# Patient Record
Sex: Male | Born: 1995 | Race: White | Hispanic: No | Marital: Single | Smoking: Never smoker
Health system: Southern US, Community
[De-identification: ages and names within clinical notes are randomized; demographics above are authoritative.]

## PROBLEM LIST (undated history)

## (undated) HISTORY — PX: OTHER SURGICAL HISTORY: SHX169

---

## 2019-11-11 ENCOUNTER — Emergency Department (HOSPITAL_BASED_OUTPATIENT_CLINIC_OR_DEPARTMENT_OTHER)
Admission: EM | Admit: 2019-11-11 | Discharge: 2019-11-11 | Disposition: A | Discharge status: Home / Self Care | Attending: Emergency Medicine | Admitting: Emergency Medicine

## 2019-11-11 ENCOUNTER — Emergency Department (HOSPITAL_BASED_OUTPATIENT_CLINIC_OR_DEPARTMENT_OTHER)

## 2019-11-11 ENCOUNTER — Other Ambulatory Visit: Payer: Self-pay

## 2019-11-11 ENCOUNTER — Encounter (HOSPITAL_BASED_OUTPATIENT_CLINIC_OR_DEPARTMENT_OTHER): Payer: Self-pay

## 2019-11-11 DIAGNOSIS — M79641 Pain in right hand: Secondary | ICD-10-CM | POA: Insufficient documentation

## 2019-11-11 NOTE — ED Triage Notes (Signed)
Pt injured right hand at work ~545pm-no break in skin noted-NAD-steady gait

## 2019-11-11 NOTE — ED Notes (Signed)
Supervisor Arrie Aran informed we are not able to do ETOh , but can to UDs ,verbalundrstaindign to do UDs

## 2019-11-11 NOTE — ED Provider Notes (Signed)
[facility] EMERGENCY DEPARTMENT Provider Note   CSN: [id number] Arrival date & time: 11/11/19  1954    History Chief Complaint  Patient presents with  . Hand Injury    Marco Henderson is a 24 y.o. male with no significant past medical history who presents for evaluation of arm injury which occurred just PTA.  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  There is a piece of [place] which was stuck and he subsequently hit the palmar aspect of his right hand as well as the ulnar aspect of his right forearm.  Patient states he has pain rating from his hand through his forearm at this time.  Rates his pain a 5/10.  He has full range of motion however has pain with range of motion to his wrist.  Denies any pain at his scaphoid.  Denies redness, swelling, warmth, contusions, abrasions, lacerations, paresthesias, fever, chills, nausea or vomiting.  Denies aggravating or alleviating factors.  This is a new problem.   History obtained from patient and past medical records.  No interpreter is used.  HPI     History reviewed. No pertinent past medical history.  There are no problems to display for this patient.   Past Surgical History:  Procedure Laterality Date  . arm surgery         No family history on file.  Social History   Tobacco Use  . Smoking status: Never Smoker  . Smokeless tobacco: Never Used  Substance Use Topics  . Alcohol use: Yes    Comment: occ  . Drug use: Never    Home Medications Prior to Admission medications   Not on File    Allergies    Patient has no known allergies.  Review of Systems   Review of Systems  Constitutional: Negative.   HENT: Negative.   Respiratory: Negative.   Cardiovascular: Negative.   Gastrointestinal: Negative.   Genitourinary: Negative.   Musculoskeletal:       Right hand and forearm  Skin: Negative.   Neurological: Negative.   All other systems reviewed and are negative.   Physical  Exam Updated Vital Signs BP 101/82 (BP Location: Left Arm)   Pulse 66   Temp 98.2 F (36.8 C) (Oral)   Resp 16   Ht 6\' 1"  (1.854 m)   Wt 104.3 kg   SpO2 100%   BMI 30.34 kg/m   Physical Exam Vitals and nursing note reviewed.  Constitutional:      General: He is not in acute distress.    Appearance: He is well-developed. He is not ill-appearing, toxic-appearing or diaphoretic.  HENT:     Head: Normocephalic and atraumatic.     Mouth/Throat:     Mouth: Mucous membranes are moist.  Eyes:     Pupils: Pupils are equal, round, and reactive to light.  Cardiovascular:     Rate and Rhythm: Normal rate and regular rhythm.     Pulses: Normal pulses.          Radial pulses are 2+ on the right side and 2+ on the left side.     Heart sounds: Normal heart sounds.  Pulmonary:     Effort: Pulmonary effort is normal. No respiratory distress.     Breath sounds: Normal breath sounds.  Abdominal:     General: Bowel sounds are normal. There is no distension.     Palpations: Abdomen is soft.  Musculoskeletal:        General:  Tenderness present. No swelling, deformity or signs of injury. Normal range of motion.     Right shoulder: Normal.     Left shoulder: Normal.     Right upper arm: Normal.     Left upper arm: Normal.     Right elbow: Normal.     Left elbow: Normal.     Right forearm: Tenderness present. No swelling, edema, deformity, lacerations or bony tenderness.     Left forearm: Normal.     Right wrist: Normal. No bony tenderness or snuff box tenderness.     Left wrist: Normal.     Right hand: Tenderness present. No swelling, deformity or bony tenderness. Normal range of motion. Normal strength.     Left hand: Normal.       Hands:     Cervical back: Normal range of motion and neck supple.     Right lower leg: No edema.     Left lower leg: No edema.     Comments: Compartments soft.  Able to flex and extend as well as pronate and supinate at right upper extremity.  Moves digits  without difficulty.  No obvious injury, deformity or trauma.  He does have some tenderness to the palmar aspect of his right hand. No tenderness over scaphoid  Skin:    General: Skin is warm and dry.     Capillary Refill: Capillary refill takes less than 2 seconds.     Comments: No edema, erythema or warmth.  No fluctuance or induration.  No contusions, abrasions or lacerations  Neurological:     General: No focal deficit present.     Mental Status: He is alert.     Cranial Nerves: Cranial nerves are intact.     Sensory: Sensation is intact.     Motor: Motor function is intact.     Coordination: Coordination is intact.     Gait: Gait is intact.     Comments: 5/5 trying to bilateral upper extremities at difficulty.  Intact sensation to radial and ulnar aspect.    ED Results / Procedures / Treatments   Labs (all labs ordered are listed, but only abnormal results are displayed) Labs Reviewed  RAPID URINE DRUG SCREEN, HOSP PERFORMED    EKG None  Radiology DG Forearm Right  Result Date: 11/11/2019 CLINICAL DATA:  Pain EXAM: RIGHT FOREARM - 2 VIEW COMPARISON:  None. FINDINGS: There is no evidence of fracture or other focal bone lesions. Soft tissues are unremarkable. IMPRESSION: Negative. Electronically Signed   By: Katherine Mantle M.D.   On: 11/11/2019 20:27   DG Hand Complete Right  Result Date: 11/11/2019 CLINICAL DATA:  Pain EXAM: RIGHT HAND - COMPLETE 3+ VIEW COMPARISON:  None. FINDINGS: There is no evidence of fracture or dislocation. There is no evidence of arthropathy or other focal bone abnormality. Soft tissues are unremarkable. There is an old healed fracture of the fifth metacarpal. IMPRESSION: Negative. Electronically Signed   By: Katherine Mantle M.D.   On: 11/11/2019 20:26    Procedures Procedures (including critical care time)  Medications Ordered in ED Medications - No data to display  ED Course  I have reviewed the triage vital signs and the nursing  notes.  Pertinent labs & imaging results that were available during my care of the patient were reviewed by me and considered in my medical decision making (see chart for details).  24 year old male presents for evaluation of work injury.  He is afebrile, nonseptic, non-ill-appearing.  Diffuse tenderness to plantar  aspect of his hand which she states radiates into his forearm however I cannot reproduce his pain to his forearm.  He is able to flex, extend, pronate and supinate.  He has no pain over his scaphoid.  He has no overlying skin changes to suggest infectious process.  No contusions, abrasions or lacerations to suggest open fracture.  He has no systemic symptoms.  Plan on imaging and reassess.  Imaging personally interpreted and do not show fracture/ dislocation or effusion.  Patient reassessed.  Discussed imaging findings.  Will place in Velcro splint.  He will follow up with orthopedics outpatient.  RICE for symptomatic management.  Low suspicion for occult fracture, dislocation, septic joint, compartment syndrome, DVT, myositis.  The patient has been appropriately medically screened and/or stabilized in the ED. I have low suspicion for any other emergent medical condition which would require further screening, evaluation or treatment in the ED or require inpatient management.  Patient is hemodynamically stable and in no acute distress.  Patient able to ambulate in department prior to ED.  Evaluation does not show acute pathology that would require ongoing or additional emergent interventions while in the emergency department or further inpatient treatment.  I have discussed the diagnosis with the patient and answered all questions.  Pain is been managed while in the emergency department and patient has no further complaints prior to discharge.  Patient is comfortable with plan discussed in room and is stable for discharge at this time.  I have discussed strict return precautions for returning  to the emergency department.  Patient was encouraged to follow-up with PCP/specialist refer to at discharge.  Employer requesting UDS.  Will obtain prior to dc    MDM Rules/Calculators/A&P                       Final Clinical Impression(s) / ED Diagnoses Final diagnoses:  Right hand pain    Rx / DC Orders ED Discharge Orders    None       Trypp Heckmann A, PA-C 11/11/19 2043    Geoffery Lyons, MD 11/11/19 2318

## 2019-11-11 NOTE — Discharge Instructions (Signed)
Take Tylenol and ibuprofen as needed for pain.  I have placed you in a splint.  I suggest resting over the next 48 hours.  If you continue to have pain beyond 5 days you need to follow with orthopedics.  Their contact information is listed in your discharge paperwork.  You need to call them to schedule an appointment

## 2020-02-06 ENCOUNTER — Emergency Department (HOSPITAL_COMMUNITY): Payer: 59

## 2020-02-06 ENCOUNTER — Encounter (HOSPITAL_COMMUNITY): Payer: Self-pay | Admitting: Emergency Medicine

## 2020-02-06 ENCOUNTER — Emergency Department (HOSPITAL_COMMUNITY)
Admission: EM | Admit: 2020-02-06 | Discharge: 2020-02-06 | Disposition: A | Discharge status: Home / Self Care | Payer: 59 | Attending: Emergency Medicine | Admitting: Emergency Medicine

## 2020-02-06 ENCOUNTER — Other Ambulatory Visit: Payer: Self-pay

## 2020-02-06 DIAGNOSIS — Y990 Civilian activity done for income or pay: Secondary | ICD-10-CM | POA: Diagnosis not present

## 2020-02-06 DIAGNOSIS — Y9289 Other specified places as the place of occurrence of the external cause: Secondary | ICD-10-CM | POA: Diagnosis not present

## 2020-02-06 DIAGNOSIS — Y9389 Activity, other specified: Secondary | ICD-10-CM | POA: Diagnosis not present

## 2020-02-06 DIAGNOSIS — W228XXA Striking against or struck by other objects, initial encounter: Secondary | ICD-10-CM | POA: Insufficient documentation

## 2020-02-06 DIAGNOSIS — S299XXA Unspecified injury of thorax, initial encounter: Secondary | ICD-10-CM | POA: Diagnosis present

## 2020-02-06 DIAGNOSIS — S20211A Contusion of right front wall of thorax, initial encounter: Secondary | ICD-10-CM

## 2020-02-06 MED ORDER — ONDANSETRON 4 MG PO TBDP
4.0000 mg | ORAL_TABLET | Freq: Once | ORAL | Status: AC
  Administered 2020-02-06: 4 mg via ORAL
  Filled 2020-02-06: qty 1

## 2020-02-06 MED ORDER — HYDROCODONE-ACETAMINOPHEN 5-325 MG PO TABS
2.0000 | ORAL_TABLET | Freq: Once | ORAL | Status: AC
  Administered 2020-02-06: 2 via ORAL
  Filled 2020-02-06: qty 2

## 2020-02-06 MED ORDER — IBUPROFEN 800 MG PO TABS
800.0000 mg | ORAL_TABLET | Freq: Once | ORAL | Status: AC
  Administered 2020-02-06: 800 mg via ORAL
  Filled 2020-02-06: qty 1

## 2020-02-06 NOTE — ED Triage Notes (Signed)
Patient with right lower rib pain after accident at work.  Patient states that pieces of [place] come off a conveyer belt and two were coming off at once and one hit him on the right side of his ribs under his right breast.  Patient denies any shortness of breath, but does have pain with exhalation.  Patient does have bruising and abrasion to the area.

## 2020-02-06 NOTE — Discharge Instructions (Addendum)
You may alternate Tylenol 1000 mg every 6 hours as needed for pain and Ibuprofen 800 mg every 8 hours as needed for pain.  Please take Ibuprofen with food.  Do not take more than 4000 mg of Tylenol (acetaminophen) in a 24 hour period.  Your x-ray showed no sign of fracture, collapsed lung, pulmonary contusion.  You may use your incentive spirometer every 1-2 hours while awake for the next 2 weeks to help prevent pneumonia.  You are safe to return to work on Tuesday, July 6.  Steps to find a Primary Care Provider (PCP):  Call [phone] or [phone] to access "[facility] Find a Doctor Service."  2.  You may also go on the [facility] website at [email or web address]  3.  [facility] also frequently accepts new patients.  [facility]  [address] [place] [address] [phone]  4.  There are also multiple [facility], [facility] and [facility] practices throughout the [place] that are frequently accepting new patients. You may find a clinic that is close to your home and contact them.  [facility] [email or web address] [phone]  [facility] [email or web address]  [email or web address] [phone]  [facility] [email or web address] [phone]  5.  Local Health Departments also can provide primary care services.  [facility]  [address] [place] [address] [phone]  [facility] Department [address] [place] [address] [phone]  [facility] Department [address] [place] [address]  [place] [address] [phone]

## 2020-02-06 NOTE — ED Provider Notes (Signed)
TIME SEEN: 6:10 AM  CHIEF COMPLAINT: Right rib pain  HPI: Patient is a 24 year old male with no significant past medical history who presents to the emergency department with right anterior lower rib pain after he was hit in the chest with a board.  No other injury.  No abdominal pain.  No difficulty breathing.  ROS: See HPI Constitutional: no fever  Eyes: no drainage  ENT: no runny nose   Cardiovascular:  no chest pain  Resp: no SOB  GI: no vomiting GU: no dysuria Integumentary: no rash  Allergy: no hives  Musculoskeletal: no leg swelling  Neurological: no slurred speech ROS otherwise negative  PAST MEDICAL HISTORY/PAST SURGICAL HISTORY:  History reviewed. No pertinent past medical history.  MEDICATIONS:  Prior to Admission medications   Not on File    ALLERGIES:  No Known Allergies  SOCIAL HISTORY:  Social History   Tobacco Use  . Smoking status: Never Smoker  . Smokeless tobacco: Never Used  Substance Use Topics  . Alcohol use: Yes    Comment: occ    FAMILY HISTORY: No family history on file.  EXAM: BP 131/71 (BP Location: Left Arm)   Pulse 70   Temp 98.4 F (36.9 C) (Oral)   Resp 16   SpO2 100%  CONSTITUTIONAL: Alert and oriented and responds appropriately to questions. Well-appearing; well-nourished HEAD: Normocephalic EYES: Conjunctivae clear, pupils appear equal, EOM appear intact ENT: normal nose; moist mucous membranes NECK: Supple, normal ROM CARD: RRR; S1 and S2 appreciated; no murmurs, no clicks, no rubs, no gallops CHEST:  Chest wall is tender to palpation over the right anterior lower ribs without deformity, crepitus.  He has an abrasion to this area.  No laceration. RESP: Normal chest excursion without splinting or tachypnea; breath sounds clear and equal bilaterally; no wheezes, no rhonchi, no rales, no hypoxia or respiratory distress, speaking full sentences ABD/GI: Normal bowel sounds; non-distended; soft, non-tender, no rebound, no  guarding, no peritoneal signs, no hepatosplenomegaly, specifically no tenderness in the right upper quadrant BACK:  The back appears normal EXT: Normal ROM in all joints; no deformity noted, no edema; no cyanosis SKIN: Normal color for age and race; warm; no rash on exposed skin NEURO: Moves all extremities equally PSYCH: The patient's mood and manner are appropriate.   MEDICAL DECISION MAKING: Patient here with right rib injury.  X-ray showed no fracture, no pneumothorax, pulmonary contusion.  No hypoxia here.  Will provide with pain medication but discharged with instructions alternate Tylenol and Motrin.  Provided with incentive spirometer.  Provided with work note.  Given outpatient occupational therapy, PCP follow-up.  No other injury on exam.  At this time, I do not feel there is any life-threatening condition present. I have reviewed, interpreted and discussed all results (EKG, imaging, lab, urine as appropriate) and exam findings with patient/family. I have reviewed nursing notes and appropriate previous records.  I feel the patient is safe to be discharged home without further emergent workup and can continue workup as an outpatient as needed. Discussed usual and customary return precautions. Patient/family verbalize understanding and are comfortable with this plan.  Outpatient follow-up has been provided as needed. All questions have been answered.    Timber Lucarelli was evaluated in Emergency Department on 02/06/2020 for the symptoms described in the history of present illness. He was evaluated in the context of the global COVID-19 pandemic, which necessitated consideration that the patient might be at risk for infection with the SARS-CoV-2 virus that causes COVID-19. Institutional  protocols and algorithms that pertain to the evaluation of patients at risk for COVID-19 are in a state of rapid change based on information released by regulatory bodies including the CDC and federal and state  organizations. These policies and algorithms were followed during the patient's care in the ED.      Wilena Tyndall, Layla Maw, DO 02/06/20 [phone]

## 2021-04-08 IMAGING — CR DG HAND COMPLETE 3+V*R*
3 series · 3 of 3 positions shown · non-contrast
Comparison: None.

CLINICAL DATA: Pain

EXAM:
RIGHT HAND - COMPLETE 3+ VIEW

[x hand pa right]
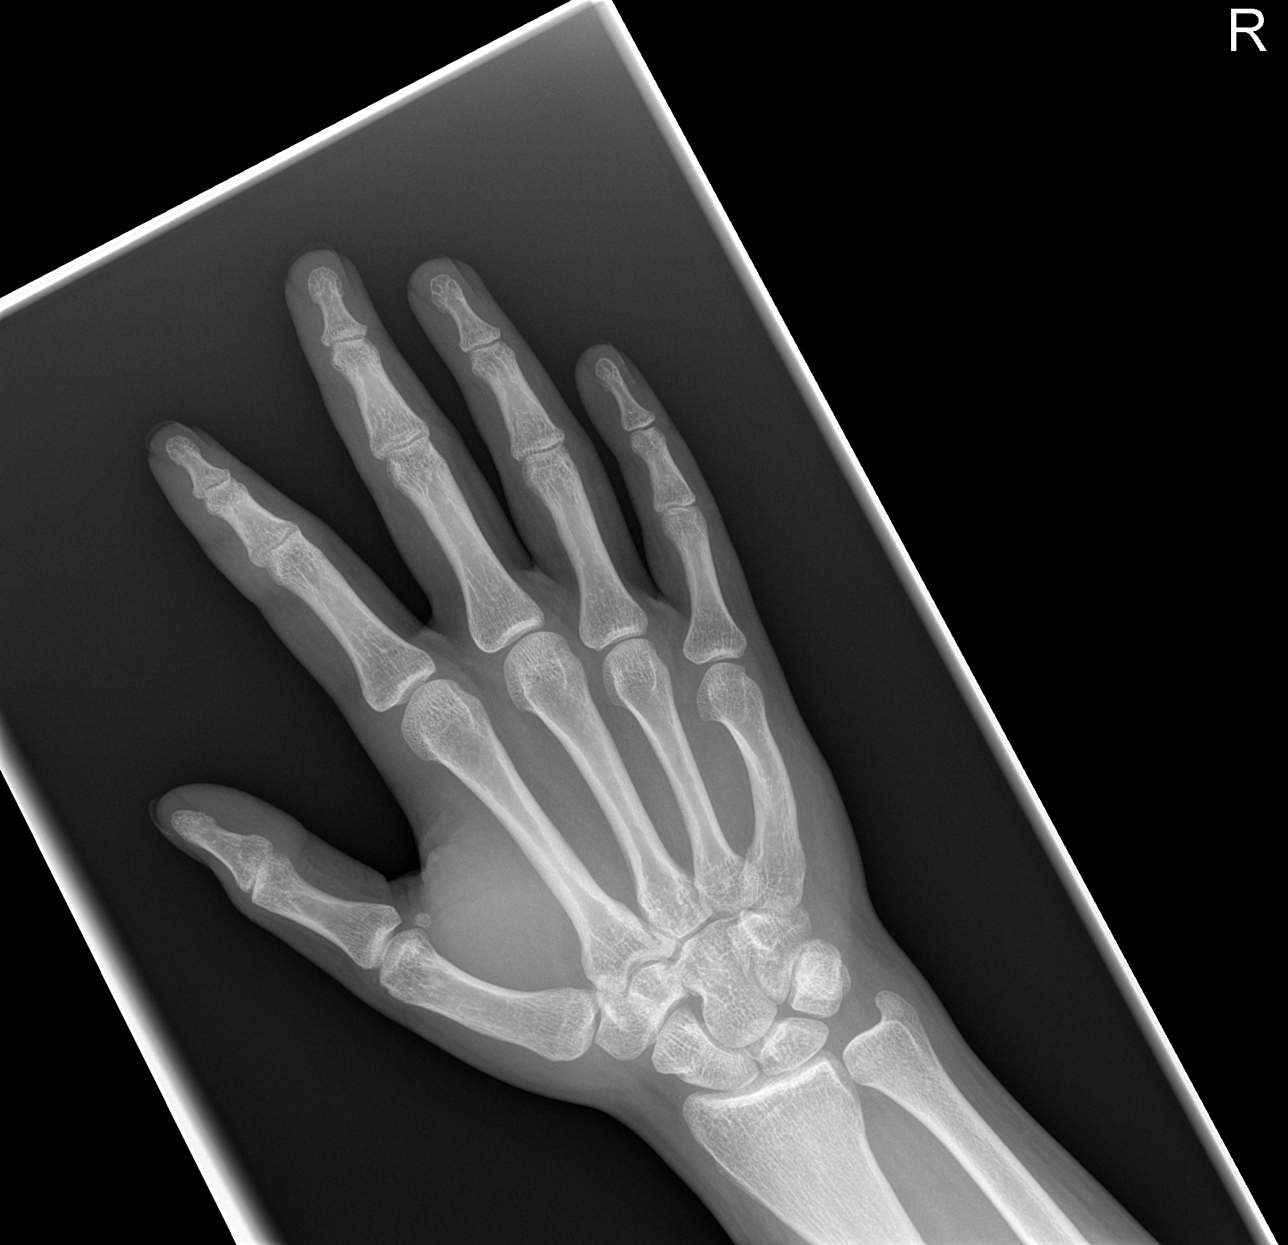

[x hand oblique right]
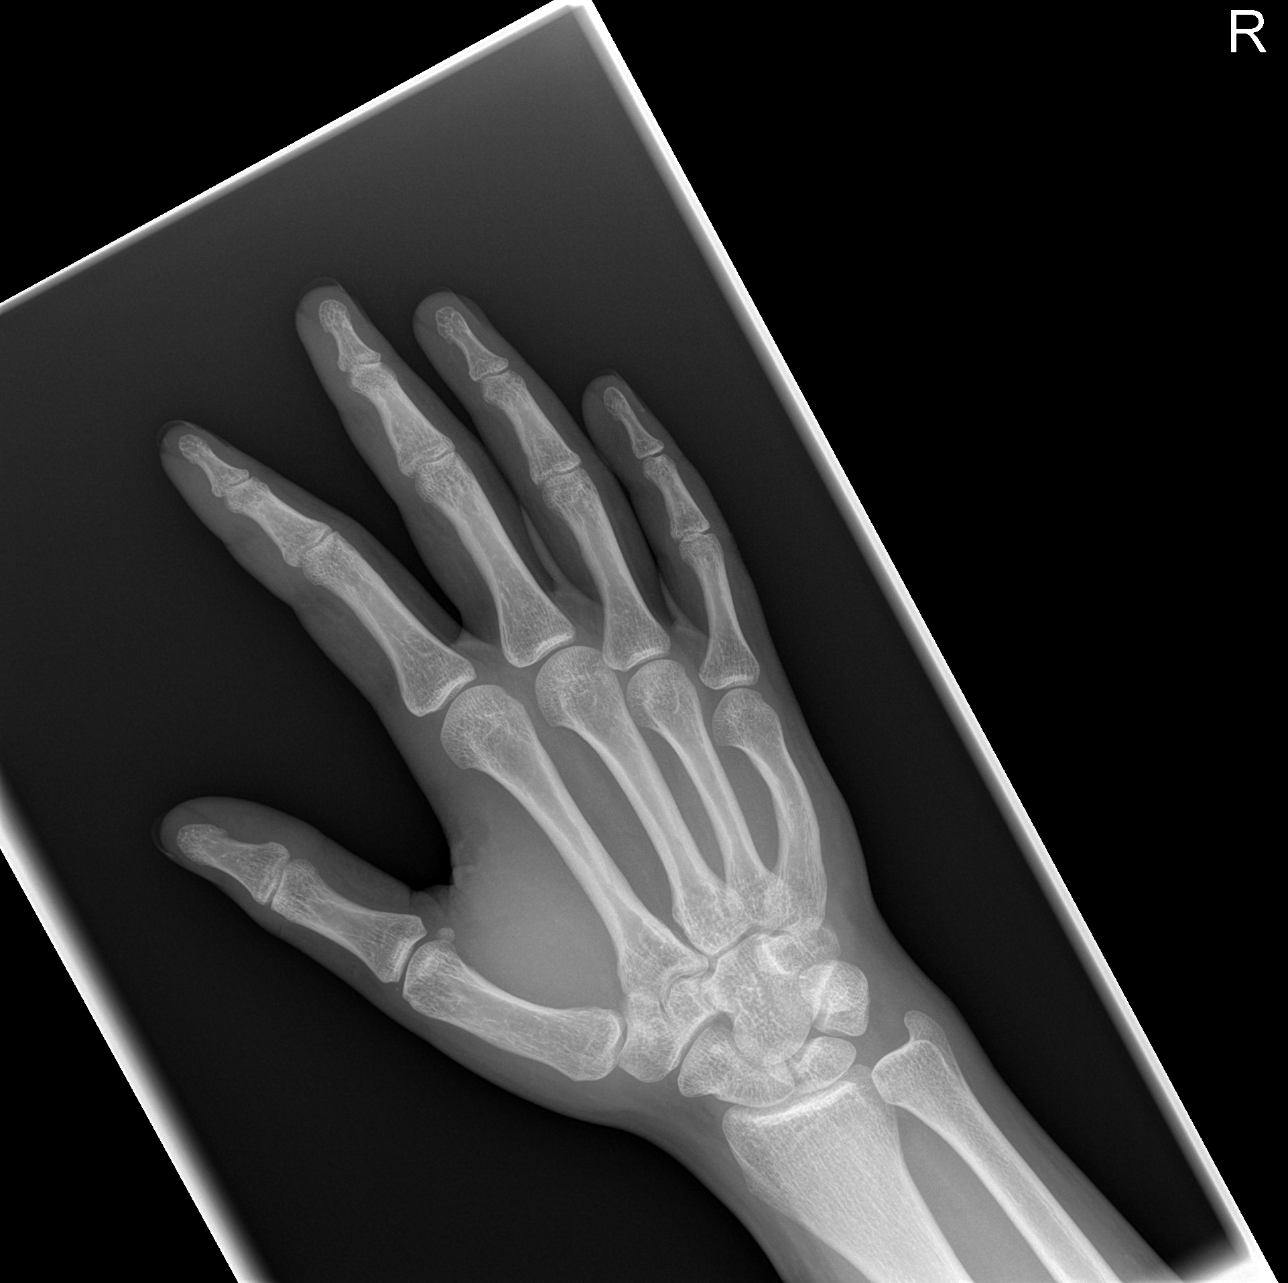

[x hand lat right]
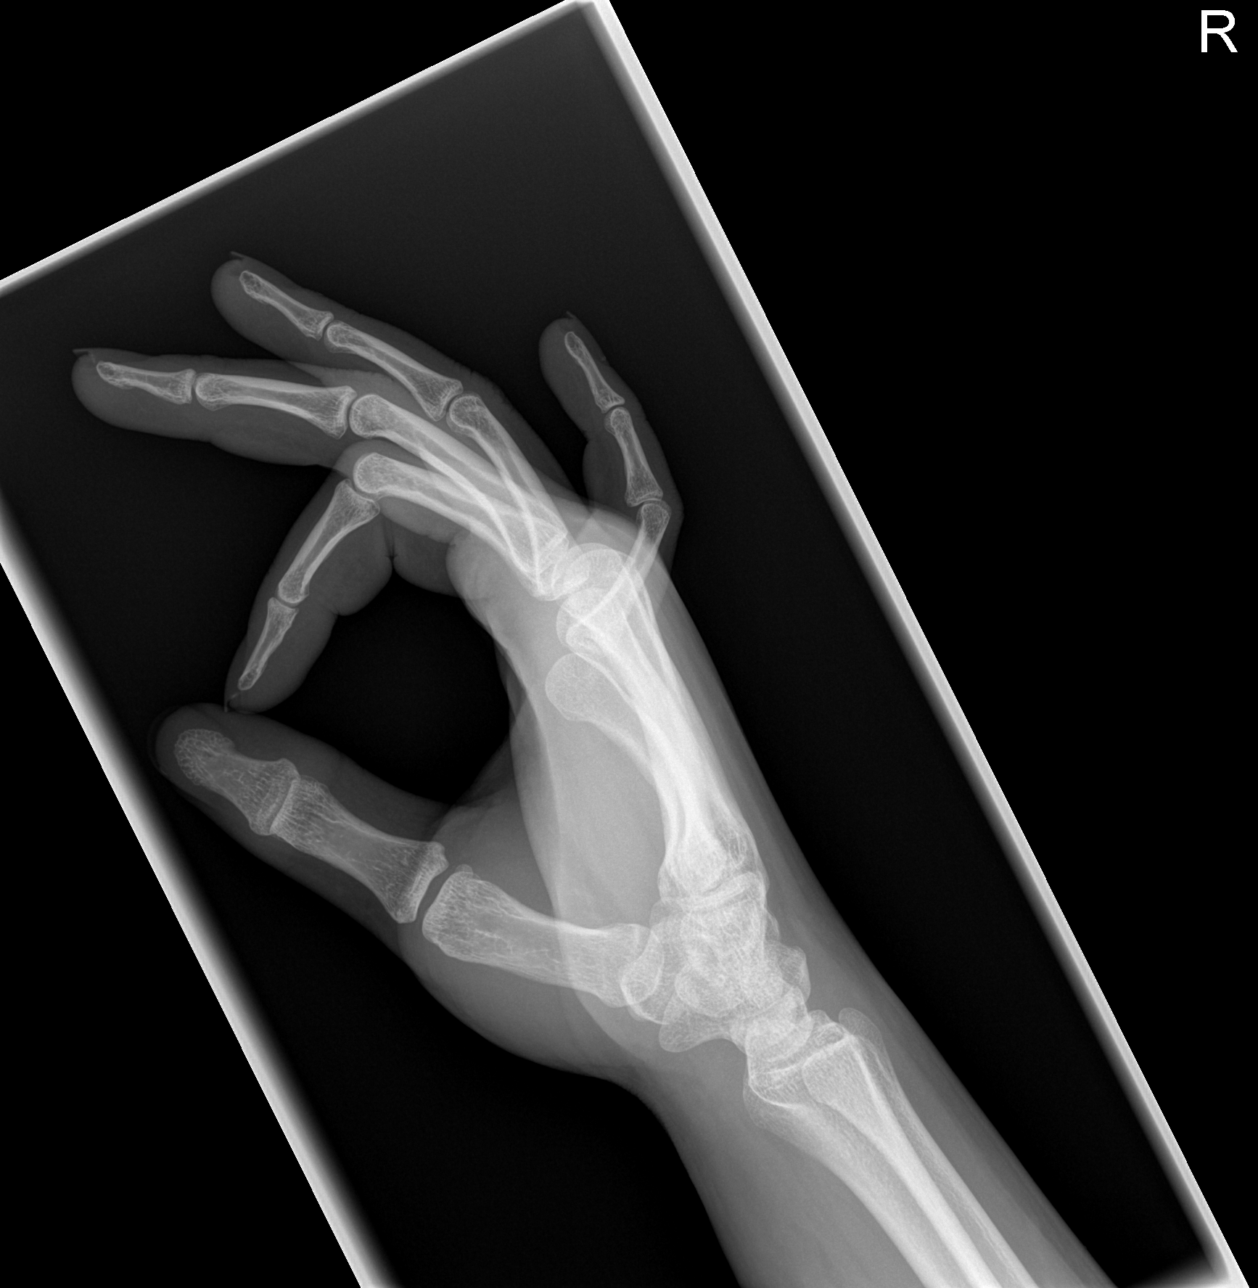

[3 of 3 positions shown; findings below may reference images not displayed]

FINDINGS: There is no evidence of fracture or dislocation. There is no
evidence of arthropathy or other focal bone abnormality. Soft
tissues are unremarkable. There is an old healed fracture of the
fifth metacarpal.
IMPRESSION: Negative.

## 2021-07-04 IMAGING — DX DG RIBS W/ CHEST 3+V*R*
3 series · 3 of 3 positions shown · non-contrast
Comparison: None.

CLINICAL DATA: Initial evaluation for acute pain/trauma to lower
ribcage.

EXAM:
RIGHT RIBS AND CHEST - 3+ VIEW

[chest pa]
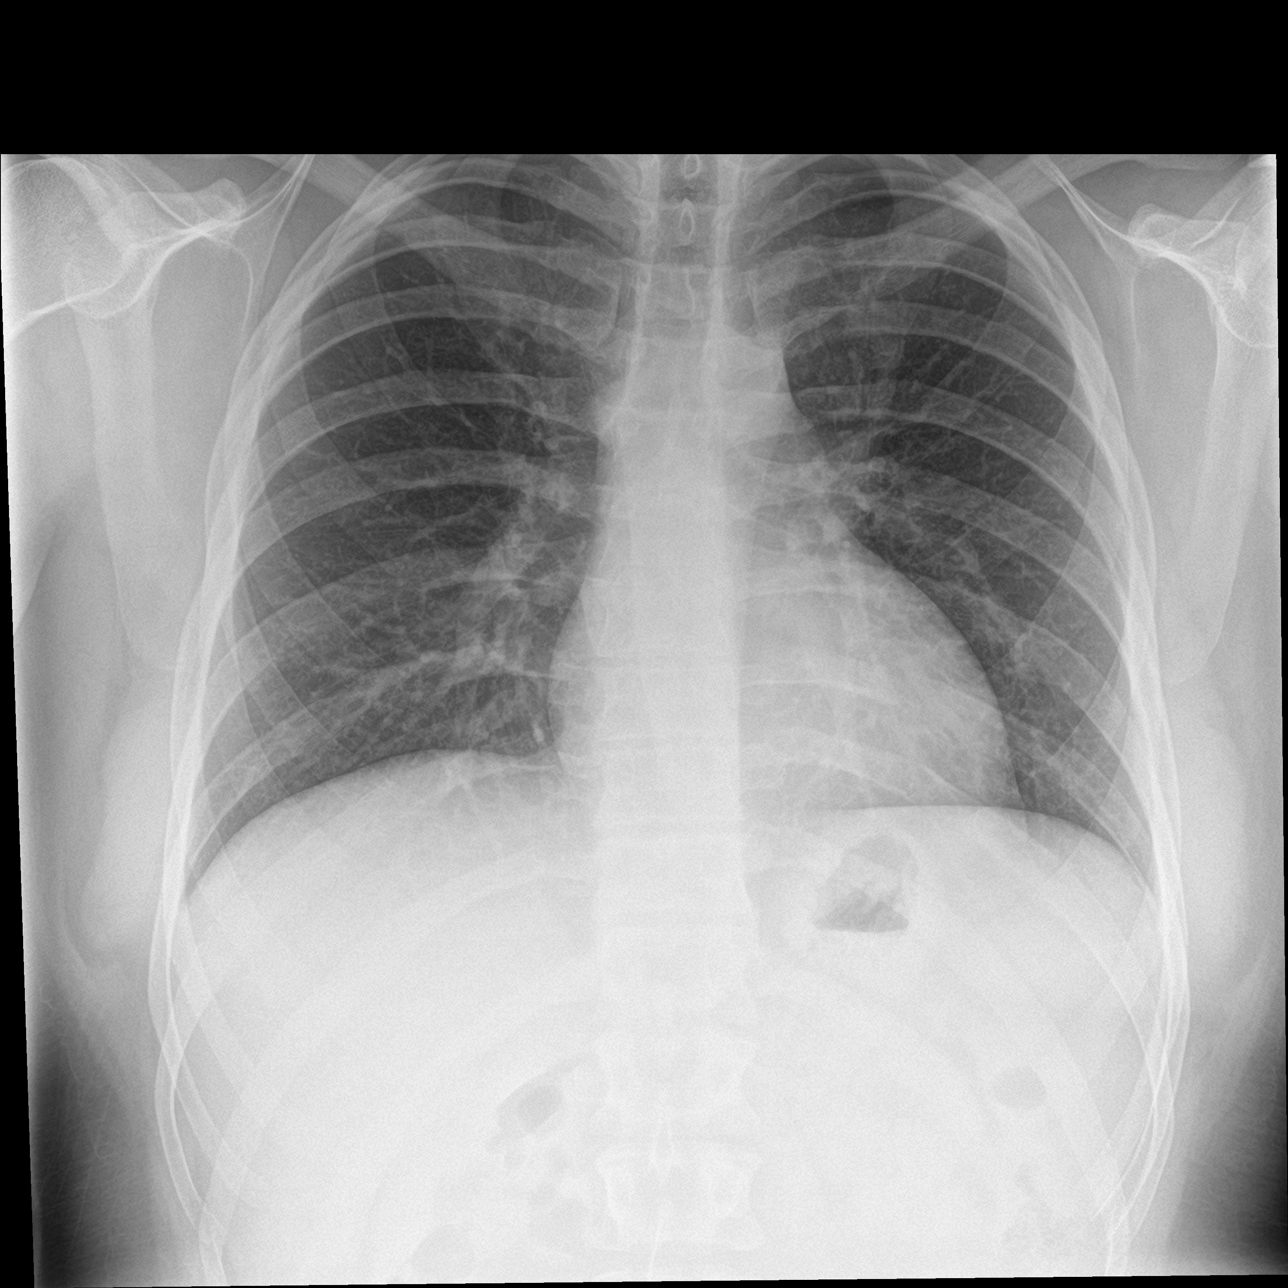

[rib pa]
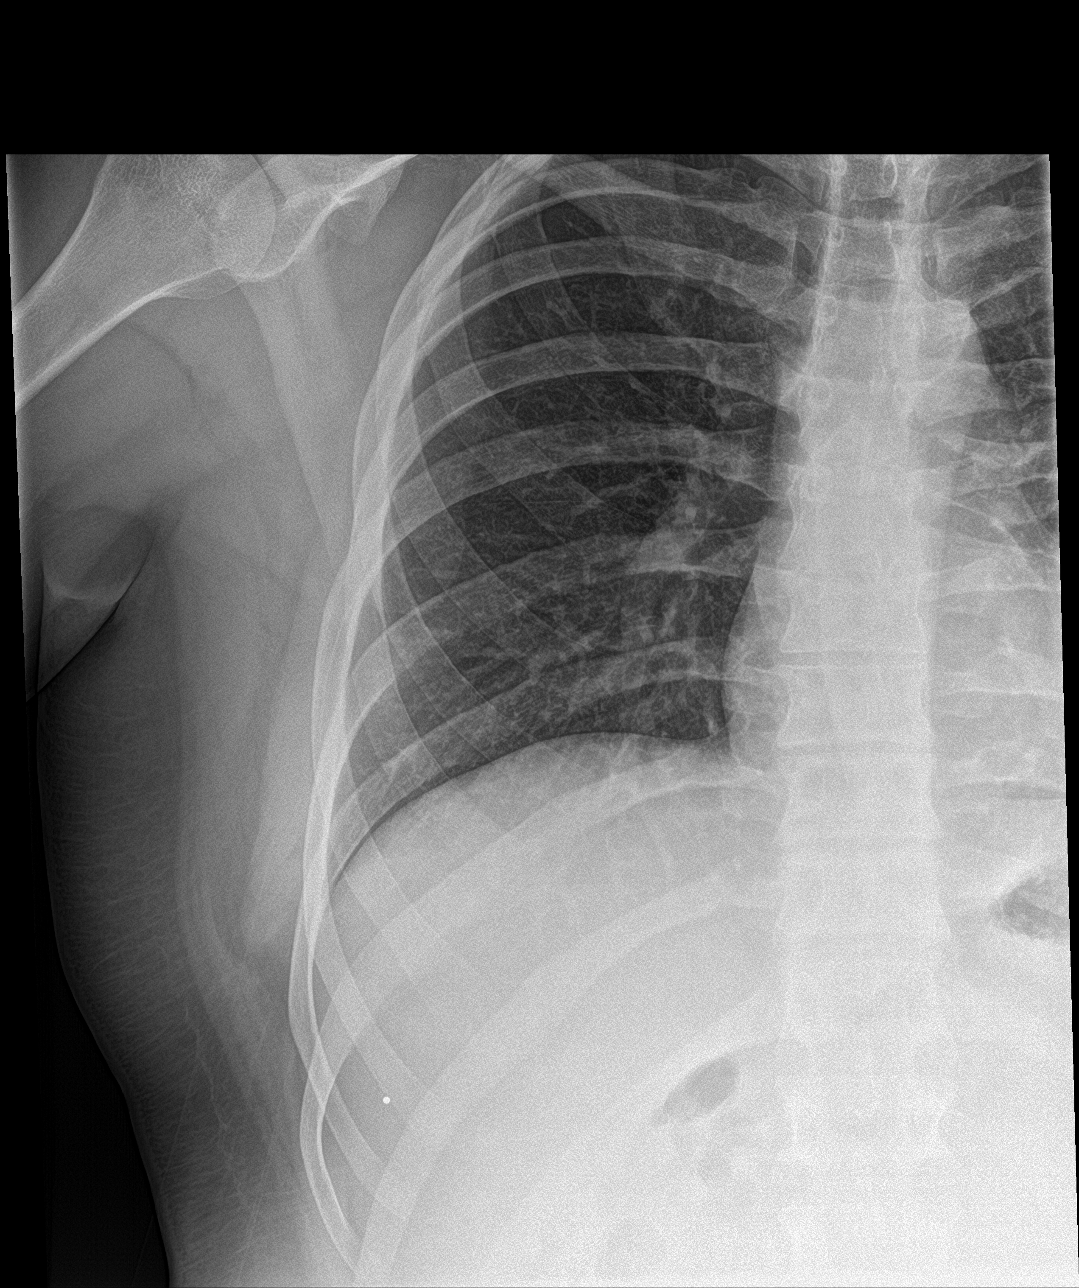

[rib pa obl]
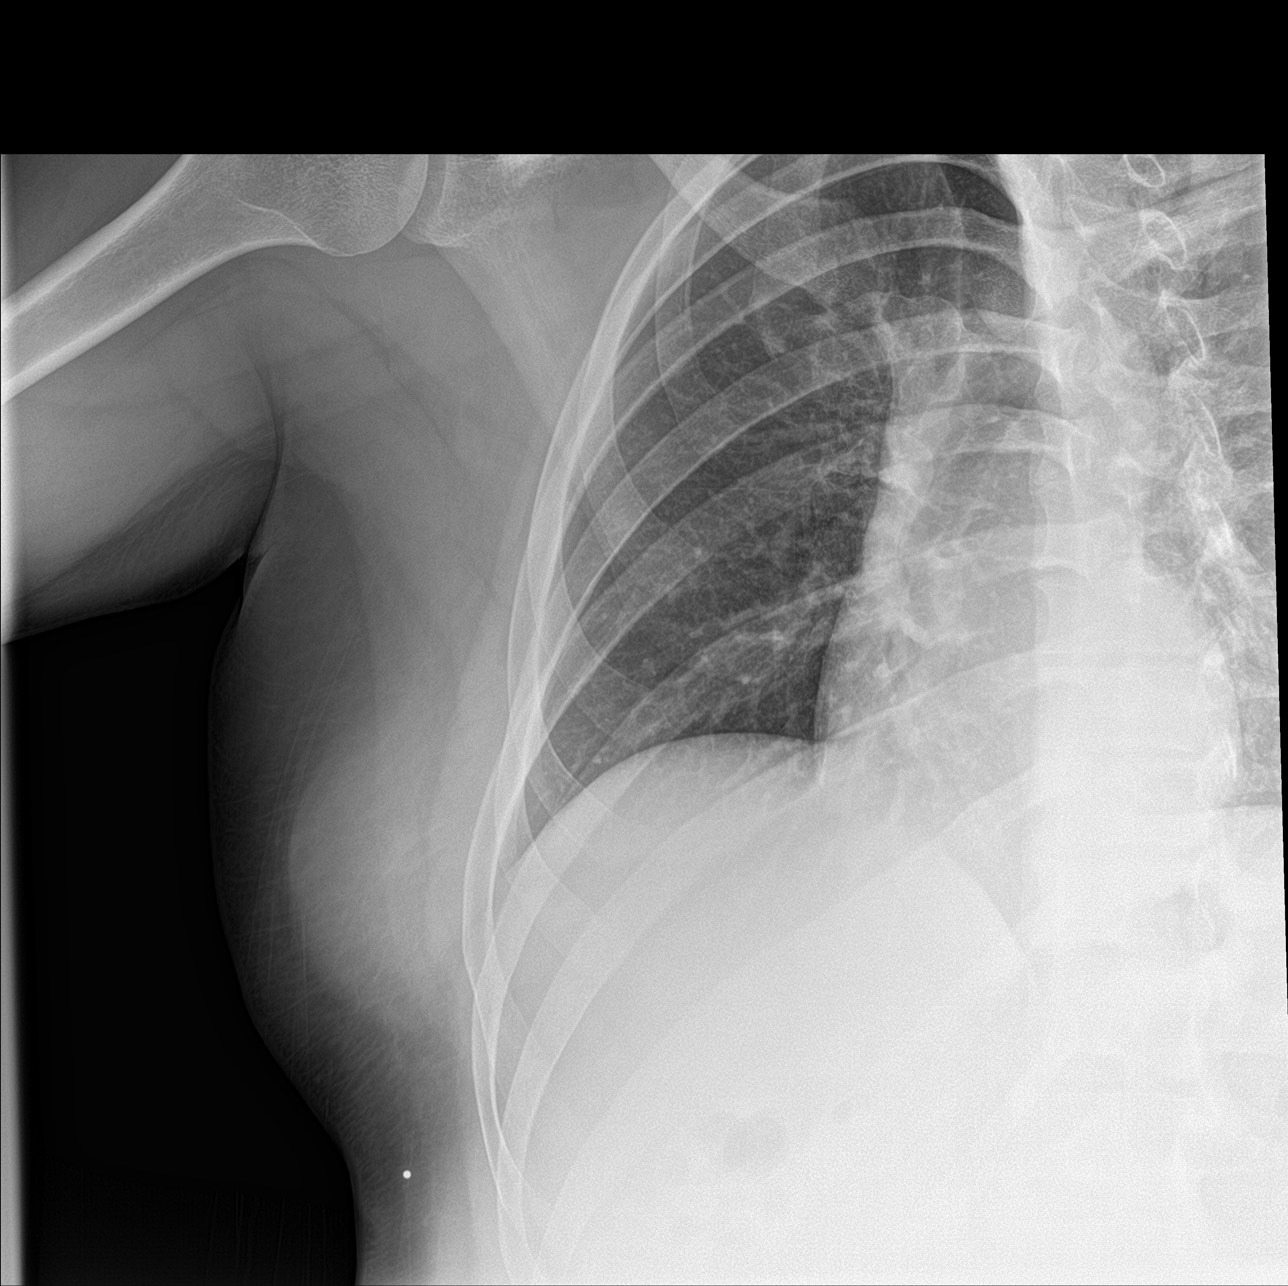

[3 of 3 positions shown; findings below may reference images not displayed]

FINDINGS: Cardiac and mediastinal silhouettes are within normal limits.

Lungs normally inflated. No focal infiltrates. No edema or effusion.
No pneumothorax.

Metallic BB marker overlies the lower right ribs at site of pain. No
acute displaced rib fracture identified. No other acute osseous
abnormality.
IMPRESSION: 1. No acute displaced rib fracture identified.
2. No other active cardiopulmonary disease.
